# Patient Record
Sex: Male | Born: 1990 | Race: Black or African American | Hispanic: No | Marital: Single | Smoking: Current some day smoker
Health system: Southern US, Community
[De-identification: ages and names within clinical notes are randomized; demographics above are authoritative.]

## PROBLEM LIST (undated history)

## (undated) HISTORY — PX: WISDOM TOOTH EXTRACTION: SHX21

---
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
## 2013-07-19 ENCOUNTER — Emergency Department (HOSPITAL_COMMUNITY)
Admission: EM | Admit: 2013-07-19 | Discharge: 2013-07-19 | Disposition: A | Discharge status: Home / Self Care | Payer: BC Managed Care – PPO | Attending: Emergency Medicine | Admitting: Emergency Medicine

## 2013-07-19 ENCOUNTER — Encounter (HOSPITAL_COMMUNITY): Payer: Self-pay | Admitting: Emergency Medicine

## 2013-07-19 DIAGNOSIS — A64 Unspecified sexually transmitted disease: Secondary | ICD-10-CM | POA: Insufficient documentation

## 2013-07-19 DIAGNOSIS — F17200 Nicotine dependence, unspecified, uncomplicated: Secondary | ICD-10-CM | POA: Insufficient documentation

## 2013-07-19 LAB — URINE MICROSCOPIC-ADD ON

## 2013-07-19 LAB — URINALYSIS, ROUTINE W REFLEX MICROSCOPIC
Bilirubin Urine: NEGATIVE
GLUCOSE, UA: NEGATIVE mg/dL
HGB URINE DIPSTICK: NEGATIVE
Ketones, ur: NEGATIVE mg/dL
Nitrite: NEGATIVE
PH: 6 (ref 5.0–8.0)
PROTEIN: NEGATIVE mg/dL
Specific Gravity, Urine: 1.031 — ABNORMAL HIGH (ref 1.005–1.030)
Urobilinogen, UA: 0.2 mg/dL (ref 0.0–1.0)

## 2013-07-19 LAB — RPR: RPR: NONREACTIVE

## 2013-07-19 MED ORDER — CEFTRIAXONE SODIUM 250 MG IJ SOLR
250.0000 mg | Freq: Once | INTRAMUSCULAR | Status: AC
  Administered 2013-07-19: 250 mg via INTRAMUSCULAR
  Filled 2013-07-19: qty 250

## 2013-07-19 MED ORDER — AZITHROMYCIN 250 MG PO TABS
1000.0000 mg | ORAL_TABLET | Freq: Once | ORAL | Status: AC
  Administered 2013-07-19: 1000 mg via ORAL
  Filled 2013-07-19: qty 4

## 2013-07-19 MED ORDER — LIDOCAINE HCL (PF) 1 % IJ SOLN
INTRAMUSCULAR | Status: AC
  Administered 2013-07-19: 5 mL
  Filled 2013-07-19: qty 5

## 2013-07-19 NOTE — ED Notes (Signed)
Pt reports burning sensation with urination. States that he is sexually active and not always using protection. No other complaints

## 2013-07-19 NOTE — ED Notes (Signed)
Assisted Lauren, PA with GC/Chlamydia swab

## 2013-07-19 NOTE — Discharge Instructions (Signed)
You have been treated in the emergency department for an infection, possibly sexually transmitted. Results of your gonorrhea and chlamydia tests are pending and you will be notified if they are positive.  Do not have any sexual consult for 7 days after you have been treated. It is very important to practice safe sex and use condoms when sexually active. If your results are positive you need to notify all sexual partners so they can be treated as well. The website [email or web address].net/ can be used to send anonymous text messages or emails to alert sexual contacts. Follow up with your doctor.  Gonorrhea and Chlamydia SYMPTOMS  In females, symptoms may go unnoticed. Symptoms that are more noticeable can include:  Belly (abdominal) pain.  Painful intercourse.  Watery mucous-like discharge from the vagina.  Miscarriage.  Discomfort when urinating.  Inflammation of the rectum.  Abnormal gray-green frothy vaginal discharge  Vaginal itching and irritatio  Itching and irritation of the area outside the vagina.   Painful urination.  Bleeding after sexual intercourse.  In males, symptoms include:  Burning with urination.  Pain in the testicles.  Watery mucous-like discharge from the penis.  It can cause longstanding (chronic) pelvic pain after frequent infections.  TREATMENT  PID can cause women to not be able to have children (sterile) if left untreated or if half-treated.  It is important to finish ALL medications given to you.  This is a sexually transmitted infection. So you are also at risk for other sexually transmitted diseases, including HIV (AIDS), it is recommended that you get tested. HOME CARE INSTRUCTIONS  Warning: This infection is contagious. Do not have sex until treatment is completed. Follow up at your caregiver's office or the clinic to which you were referred. If your diagnosis (learning what is wrong) is confirmed by culture or some other method, your recent sexual contacts  need treatment. Even if they are symptom free or have a negative culture or evaluation, they should be treated.  PREVENTION  Women should use sanitary pads instead of tampons for vaginal discharge.  Wipe front to back after using the toilet and avoid douching.   Practice safe sex, use condoms, have only one sex partner and be sure your sex partner is not having sex with others.  Ask your caregiver to test you for chlamydia at your regular checkups or sooner if you are having symptoms.  Ask for further information if you are pregnant.  SEEK IMMEDIATE MEDICAL CARE IF:  You develop an oral temperature above 102 F (38.9 C), not controlled by medications or lasting more than 2 days.  You develop an increase in pain.  You develop any type of abnormal discharge.  You develop vaginal bleeding and it is not time for your period.  You develop painful intercourse.   Bacterial Vaginosis  Bacterial vaginosis (BV) is a vaginal infection where the normal balance of bacteria in the vagina is disrupted. This is not a sexually transmitted disease and your sexual partners do NOT need to be treated. CAUSES  The cause of BV is not fully understood. BV develops when there is an increase or imbalance of harmful bacteria.  Some activities or behaviors can upset the normal balance of bacteria in the vagina and put women at increased risk including:  Having a new sex partner or multiple sex partners.  Douching.  Using an intrauterine device (IUD) for contraception.  It is not clear what role sexual activity plays in the development of BV. However, women that have  never had sexual intercourse are rarely infected with BV.  Women do not get BV from toilet seats, bedding, swimming pools or from touching objects around them.   SYMPTOMS  Grey vaginal discharge.  A fish-like odor with discharge, especially after sexual intercourse.  Itching or burning of the vagina and vulva.  Burning or pain with urination.  Some  women have no signs or symptoms at all.   TREATMENT  Sometimes BV will clear up without treatment.  BV may be treated with antibiotics.  BV can recur after treatment. If this happens, a second round of antibiotics will often be prescribed.  HOME CARE INSTRUCTIONS  Finish all medication as directed by your caregiver.  Do not have sex until treatment is completed.  Do NOT drink any alcoholic beverages while being treated  with Metronidazole (Flagyl). This will cause a severe reaction inducing vomiting.  RESOURCE GUIDE  Dental Problems  Patients with Medicaid: [facility] [phone] [address]                                           [phone] [employer] [address] Phone:  [phone]                                                  Phone:  [phone]  If unable to pay or uninsured, contact:  [facility] or [facility] to become qualified for the adult dental clinic.  Chronic Pain Problems Contact [facility]  [phone] Patients need to be referred by their primary care doctor.  Insufficient Money for Medicine Contact [employer] Way:  call "211" or [facility] [phone].  No Primary Care Doctor Call [facility]  [phone] Other agencies that provide inexpensive medical care    [facility]  [phone]    [facility]  [phone]    [facility]  [phone]    [facility]  [phone]    [facility]  [phone]    [facility]  [phone]  Psychological Services [facility]  [phone] [facility]  [phone] [facility]   [phone] (emergency services [phone])  Substance Abuse Resources [facility]  (716)841-2556 [facility] [phone] [facility] [place] [phone] Floydene Flock [phone] Residential & Outpatient Substance Abuse Program  [phone]  Abuse/Neglect [facility] Child Abuse Hotline 615 579 1302 [facility] Child Abuse Hotline (930)086-4967 (After Hours)  Emergency Shelter [facility] [phone]  [facility] [place] [facility] [place] [phone] [facility] [phone]  MRSA Hotline #:   (260)325-0581    [facility] Resources  [facility]     [employer]                          [facility] Dept. [address] [place]                       [address]  [address]  [place]                                                [place]                            [place] Phone:  [phone]                                   Phone:  [phone]                 Phone:  [phone]  [facility] Phone:  [phone]  [facility] Child Abuse Hotline [phone](336) [phone] [phone](336) [phone] (After Hours)

## 2013-07-19 NOTE — ED Provider Notes (Signed)
CSN: [id number]     Arrival date & time 07/19/13  1238 History  This chart was scribed for non-physician practitioner, Clabe Seal, PA-C, working with Gavin Pound. Oletta Lamas, MD by Shari Heritage, ED Scribe. This patient was seen in room TR11C/TR11C and the patient's care was started at 3:30 PM.  Chief Complaint  Patient presents with  . Dysuria    The history is provided by the patient. No language interpreter was used.    HPI Comments: Marcus Villanueva is a 23 y.o. male who presents to the Emergency Department complaining of constant burning dysuria onset 3-4 days ago. Patient has been trying home remedies thinking that his symptoms were due a UTI with no symptom relief. He denies associated penile or testicular lesions, testicular or penile swelling, penile discharge, penile or testicular pain, or any other urinary symptoms. There is no fever, abdominal pain, groin pain, or flank pain. He is sexually active and does not always use prophylactic measures.   History reviewed. No pertinent past medical history. Past Surgical History  Procedure Laterality Date  . Wisdom tooth extraction     History reviewed. No pertinent family history. History  Substance Use Topics  . Smoking status: Current Some Day Smoker  . Smokeless tobacco: Not on file  . Alcohol Use: No    Review of Systems  Constitutional: Negative for fever and chills.  Gastrointestinal: Negative for abdominal pain.  Genitourinary: Positive for dysuria. Negative for urgency, frequency, hematuria, flank pain, discharge, penile swelling, scrotal swelling, difficulty urinating, genital sores, penile pain and testicular pain.    Allergies  Review of patient's allergies indicates no known allergies.  Home Medications  No current outpatient prescriptions on file. Triage Vitals: BP 119/56  Pulse 58  Temp(Src) 98 F (36.7 C) (Oral)  Resp 18  Wt 144 lb 4 oz (65.431 kg)  SpO2 97% Physical Exam  Nursing note and vitals  reviewed. Constitutional: He is oriented to person, place, and time. He appears well-developed and well-nourished. No distress.  HENT:  Head: Normocephalic and atraumatic.  Eyes: EOM are normal.  Neck: Neck supple. No tracheal deviation present.  Cardiovascular: Normal rate.   Pulmonary/Chest: Effort normal. No respiratory distress.  Genitourinary: Testes normal. Right testis shows no swelling and no tenderness. Left testis shows no swelling and no tenderness. No penile tenderness. Discharge (yellow, purulent) found.  No lesions seen.  Musculoskeletal: Normal range of motion.  Lymphadenopathy:       Right: Inguinal adenopathy present.       Left: Inguinal adenopathy present.  Neurological: He is alert and oriented to person, place, and time.  Skin: Skin is warm and dry.  Psychiatric: He has a normal mood and affect. His behavior is normal.    ED Course  Procedures (including critical care time)   COORDINATION OF CARE: 3:33 PM- Patient informed of current plan for treatment and evaluation and agrees with plan at this time.  Results for orders placed during the hospital encounter of 07/19/13  URINALYSIS, ROUTINE W REFLEX MICROSCOPIC      Result Value Ref Range   Color, Urine YELLOW  YELLOW   APPearance CLEAR  CLEAR   Specific Gravity, Urine 1.031 (*) 1.005 - 1.030   pH 6.0  5.0 - 8.0   Glucose, UA NEGATIVE  NEGATIVE mg/dL   Hgb urine dipstick NEGATIVE  NEGATIVE   Bilirubin Urine NEGATIVE  NEGATIVE   Ketones, ur NEGATIVE  NEGATIVE mg/dL   Protein, ur NEGATIVE  NEGATIVE mg/dL   Urobilinogen,  UA 0.2  0.0 - 1.0 mg/dL   Nitrite NEGATIVE  NEGATIVE   Leukocytes, UA TRACE (*) NEGATIVE  URINE MICROSCOPIC-ADD ON      Result Value Ref Range   Squamous Epithelial / LPF RARE  RARE   WBC, UA 11-20  <3 WBC/hpf   RBC / HPF 0-2  <3 RBC/hpf   Bacteria, UA FEW (*) RARE   Urine-Other MUCOUS PRESENT       MDM   Final diagnoses:  STI (sexually transmitted infection)   Pt with purulent  penile discharge on exam. STD panel ordered.  Will treat for GC/Chlamydia. Discussed lab results, and treatment plan with the patient. Return precautions given. Reports understanding and no other concerns at this time.  Patient is stable for discharge at this time.  Meds given in ED:  Medications  cefTRIAXone (ROCEPHIN) injection 250 mg (250 mg Intramuscular Given 07/19/13 1547)  azithromycin (ZITHROMAX) tablet 1,000 mg (1,000 mg Oral Given 07/19/13 1548)  lidocaine (PF) (XYLOCAINE) 1 % injection (5 mLs  Given 07/19/13 1548)    There are no discharge medications for this patient.  I personally performed the services described in this documentation, which was scribed in my presence. The recorded information has been reviewed and is accurate.  Clabe SealLauren M Kyliee Ortego, PA-C 07/20/13 [phone]

## 2013-07-20 LAB — URINE CULTURE
Colony Count: NO GROWTH
Culture: NO GROWTH
Special Requests: NORMAL

## 2013-07-20 LAB — HIV ANTIBODY (ROUTINE TESTING W REFLEX): HIV: NONREACTIVE

## 2013-07-22 LAB — GC/CHLAMYDIA PROBE AMP
CT PROBE, AMP APTIMA: NEGATIVE
GC PROBE AMP APTIMA: POSITIVE — AB

## 2013-07-22 NOTE — ED Provider Notes (Signed)
Medical screening examination/treatment/procedure(s) were performed by non-physician practitioner and as supervising physician I was immediately available for consultation/collaboration.  Marcus PoundMichael Y. Oletta LamasGhim, MD 07/22/13 [phone]

## 2013-07-23 NOTE — ED Notes (Addendum)
+   Gonorrhea Patient treated with rocephin and zithromax -DHHS letter faxed

## 2013-07-26 ENCOUNTER — Telehealth (HOSPITAL_BASED_OUTPATIENT_CLINIC_OR_DEPARTMENT_OTHER): Payer: Self-pay | Admitting: *Deleted

## 2013-07-27 ENCOUNTER — Telehealth (HOSPITAL_BASED_OUTPATIENT_CLINIC_OR_DEPARTMENT_OTHER): Payer: Self-pay | Admitting: Emergency Medicine

## 2013-07-27 NOTE — Telephone Encounter (Signed)
pt returned call. ID verified x three. Notified of + Gonorrhea and that treatment was given in ED. STD instructions provided. Patient verbalized understanding.

## 2019-03-10 ENCOUNTER — Emergency Department (HOSPITAL_COMMUNITY)
Admission: EM | Admit: 2019-03-10 | Discharge: 2019-03-10 | Disposition: A | Discharge status: Home / Self Care | Payer: Self-pay | Attending: Emergency Medicine | Admitting: Emergency Medicine

## 2019-03-10 ENCOUNTER — Encounter (HOSPITAL_COMMUNITY): Payer: Self-pay | Admitting: Emergency Medicine

## 2019-03-10 ENCOUNTER — Other Ambulatory Visit: Payer: Self-pay

## 2019-03-10 ENCOUNTER — Emergency Department (HOSPITAL_COMMUNITY): Payer: Self-pay

## 2019-03-10 DIAGNOSIS — E860 Dehydration: Secondary | ICD-10-CM

## 2019-03-10 DIAGNOSIS — R404 Transient alteration of awareness: Secondary | ICD-10-CM

## 2019-03-10 DIAGNOSIS — R4182 Altered mental status, unspecified: Secondary | ICD-10-CM | POA: Insufficient documentation

## 2019-03-10 DIAGNOSIS — F17200 Nicotine dependence, unspecified, uncomplicated: Secondary | ICD-10-CM | POA: Insufficient documentation

## 2019-03-10 LAB — MAGNESIUM: Magnesium: 2 mg/dL (ref 1.7–2.4)

## 2019-03-10 LAB — COMPREHENSIVE METABOLIC PANEL
ALT: 14 U/L (ref 0–44)
AST: 21 U/L (ref 15–41)
Albumin: 4.1 g/dL (ref 3.5–5.0)
Alkaline Phosphatase: 57 U/L (ref 38–126)
Anion gap: 9 (ref 5–15)
BUN: 13 mg/dL (ref 6–20)
CO2: 27 mmol/L (ref 22–32)
Calcium: 9.5 mg/dL (ref 8.9–10.3)
Chloride: 102 mmol/L (ref 98–111)
Creatinine, Ser: 1.32 mg/dL — ABNORMAL HIGH (ref 0.61–1.24)
GFR calc Af Amer: >60 mL/min (ref >60)
GFR calc non Af Amer: >60 mL/min (ref >60)
Glucose, Bld: 84 mg/dL (ref 70–99)
Potassium: 4 mmol/L (ref 3.5–5.1)
Sodium: 138 mmol/L (ref 135–145)
Total Bilirubin: 1.3 mg/dL — ABNORMAL HIGH (ref 0.3–1.2)
Total Protein: 7.3 g/dL (ref 6.5–8.1)

## 2019-03-10 LAB — CBC WITH DIFFERENTIAL/PLATELET
Abs Immature Granulocytes: 0.01 10*3/uL (ref 0.00–0.07)
Basophils Absolute: 0 10*3/uL (ref 0.0–0.1)
Basophils Relative: 0 %
Eosinophils Absolute: 0.1 10*3/uL (ref 0.0–0.5)
Eosinophils Relative: 2 %
HCT: 44.2 % (ref 39.0–52.0)
Hemoglobin: 14.5 g/dL (ref 13.0–17.0)
Immature Granulocytes: 0 %
Lymphocytes Relative: 29 %
Lymphs Abs: 1.4 10*3/uL (ref 0.7–4.0)
MCH: 29.8 pg (ref 26.0–34.0)
MCHC: 32.8 g/dL (ref 30.0–36.0)
MCV: 90.8 fL (ref 80.0–100.0)
Monocytes Absolute: 0.4 10*3/uL (ref 0.1–1.0)
Monocytes Relative: 8 %
Neutro Abs: 2.9 10*3/uL (ref 1.7–7.7)
Neutrophils Relative %: 61 %
Platelets: 199 10*3/uL (ref 150–400)
RBC: 4.87 MIL/uL (ref 4.22–5.81)
RDW: 12.6 % (ref 11.5–15.5)
WBC: 4.7 10*3/uL (ref 4.0–10.5)
nRBC: 0 % (ref 0.0–0.2)

## 2019-03-10 MED ORDER — SODIUM CHLORIDE 0.9 % IV BOLUS
1000.0000 mL | Freq: Once | INTRAVENOUS | Status: AC
  Administered 2019-03-10: 1000 mL via INTRAVENOUS

## 2019-03-10 MED ORDER — SODIUM CHLORIDE 0.9 % IV BOLUS
1000.0000 mL | Freq: Once | INTRAVENOUS | Status: AC
  Administered 2019-03-10: 16:00:00 1000 mL via INTRAVENOUS

## 2019-03-10 MED ORDER — ACETAMINOPHEN 500 MG PO TABS: 500.0000 mg | ORAL_TABLET | Freq: Once | ORAL | Status: DC

## 2019-03-10 MED ORDER — SODIUM CHLORIDE 0.9 % IV SOLN
INTRAVENOUS | Status: DC
  Administered 2019-03-10: 17:00:00 via INTRAVENOUS

## 2019-03-10 NOTE — ED Triage Notes (Signed)
Pt states he was sitting on couch yesterday and eyes rolled back in his head and he had seizure like activity for approx 5 sec per his girlfriend.  Denies any complaints at this time.  No history of seizures.

## 2019-03-10 NOTE — ED Provider Notes (Signed)
[facility] EMERGENCY DEPARTMENT Provider Note   CSN: [id number] Arrival date & time: 03/10/19  1436     History   Chief Complaint Chief Complaint  Patient presents with  . ? Seizure    HPI Marcus Villanueva is a 28 y.o. male.     HPI  Patient presents after an episode of apparent loss of consciousness, with concern for seizure versus syncope. Currently the patient has no complaints, states that he is back in his usual state of health. Yesterday, approximately 24 hours ago, after eating a minimal amount throughout the day, he had episode of brief prodrome, with loss of consciousness. His girlfriend reported to him that he tensed up, was unresponsive for several moments.  Upon awakening the patient felt clammy, diaphoretic, but no pain. After less than 1 minute he states that he returned back to baseline. He reiterates no pain, head, chest, abdomen either before or after the event. No recent medication change, diet change, activity change. Patient is healthy, denies recent illness. He does have notable history of grandfather who had a seizure, and was deceased soon thereafter.     History reviewed. No pertinent past medical history.  There are no active problems to display for this patient.   Past Surgical History:  Procedure Laterality Date  . WISDOM TOOTH EXTRACTION          Home Medications    Prior to Admission medications   Not on File    Family History No family history on file.  Social History Social History   Tobacco Use  . Smoking status: Current Some Day Smoker  . Smokeless tobacco: Never Used  Substance Use Topics  . Alcohol use: No  . Drug use: No     Allergies   Patient has no known allergies.   Review of Systems Review of Systems  Constitutional:       Per HPI, otherwise negative  HENT:       Per HPI, otherwise negative  Respiratory:       Per HPI, otherwise negative  Cardiovascular:       Per HPI,  otherwise negative  Gastrointestinal: Negative for vomiting.  Endocrine:       Negative aside from HPI  Genitourinary:       Neg aside from HPI   Musculoskeletal:       Per HPI, otherwise negative  Skin: Negative.   Neurological: Positive for syncope. Negative for seizures.     Physical Exam Updated Vital Signs BP 112/81   Pulse 62   Temp 99.1 F (37.3 C) (Oral)   Resp 14   Ht 6' (1.829 m)   Wt 70.3 kg   SpO2 97%   BMI 21.02 kg/m   Physical Exam Vitals signs and nursing note reviewed.  Constitutional:      General: He is not in acute distress.    Appearance: He is well-developed.  HENT:     Head: Normocephalic and atraumatic.  Eyes:     Conjunctiva/sclera: Conjunctivae normal.  Cardiovascular:     Rate and Rhythm: Normal rate and regular rhythm.  Pulmonary:     Effort: Pulmonary effort is normal. No respiratory distress.     Breath sounds: No stridor.  Abdominal:     General: There is no distension.  Skin:    General: Skin is warm and dry.  Neurological:     Mental Status: He is alert and oriented to person, place, and time.      ED  Treatments / Results  Labs (all labs ordered are listed, but only abnormal results are displayed) Labs Reviewed  COMPREHENSIVE METABOLIC PANEL - Abnormal; Notable for the following components:      Result Value   Creatinine, Ser 1.32 (*)    Total Bilirubin 1.3 (*)    All other components within normal limits  CBC WITH DIFFERENTIAL/PLATELET  MAGNESIUM  URINALYSIS, ROUTINE W REFLEX MICROSCOPIC  CBG MONITORING, ED    EKG EKG Interpretation  Date/Time:  Sunday March 10 2019 16:19:17 EST Ventricular Rate:  71 PR Interval:    QRS Duration: 107 QT Interval:  355 QTC Calculation: 386 R Axis:   91 Text Interpretation: Sinus rhythm Inferolateral infarct, acute Borderline ST elevation, anterior leads, changes consistent with repolarization, Baseline wander in lead(s) V1   Abnormal EKG   Radiology Ct Head Wo Contrast   Result Date: 03/10/2019 CLINICAL DATA:  Seizure-like activity EXAM: CT HEAD WITHOUT CONTRAST TECHNIQUE: Contiguous axial images were obtained from the base of the skull through the vertex without intravenous contrast. COMPARISON:  None. FINDINGS: Brain: No evidence of acute infarction, hemorrhage, hydrocephalus, extra-axial collection or mass lesion/mass effect. Vascular: No hyperdense vessel or unexpected calcification. Skull: Normal. Negative for fracture or focal lesion. Sinuses/Orbits: No acute finding. Other: None. IMPRESSION: No acute intracranial pathology. Electronically Signed   By: Lauralyn Primes M.D.   On: 03/10/2019 16:34   Dg Chest Port 1 View  Result Date: 03/10/2019 CLINICAL DATA:  Seizure-like activity EXAM: PORTABLE CHEST 1 VIEW COMPARISON:  None. FINDINGS: The heart size and mediastinal contours are within normal limits. Both lungs are clear. The visualized skeletal structures are unremarkable. IMPRESSION: No acute abnormality of the lungs in AP portable projection. Electronically Signed   By: Lauralyn Primes M.D.   On: 03/10/2019 16:36    Procedures Procedures (including critical care time)  Medications Ordered in ED Medications  acetaminophen (TYLENOL) tablet 500 mg (has no administration in time range)  sodium chloride 0.9 % bolus 1,000 mL (0 mLs Intravenous Stopped 03/10/19 1726)  sodium chloride 0.9 % bolus 1,000 mL (1,000 mLs Intravenous New Bag/Given 03/10/19 1824)     Initial Impression / Assessment and Plan / ED Course  I have reviewed the triage vital signs and the nursing notes.  Pertinent labs & imaging results that were available during my care of the patient were reviewed by me and considered in my medical decision making (see chart for details).        5:50 PM CT reassuring, x-ray reassuring, initial labs notable for creatinine 1.3, abnormal for a 28 year old previously well male. He is aware of initial findings, states that he feels somewhat better after  fluid resuscitation has started.   7:02 PM Patient awake, alert, ambulatory, no distress, speaking clearly. Findings discussed again, including concern for dehydration. Findings otherwise reassuring.  Given his description of the episode, there is low suspicion for seizure, given the absence of substantial prodrome, and no postictal period. Absent chest pain, low suspicion for cardiac etiology, suspicion for his dehydration contributing to the episode.  With resolution here after 2 L fluid resuscitation, the patient was discharged in stable condition to follow-up with primary care. Final Clinical Impressions(s) / ED Diagnoses   Final diagnoses:  Altered level of consciousness  Dehydration    ED Discharge Orders    None       Gerhard Munch, MD 03/10/19 [phone]

## 2019-03-10 NOTE — ED Notes (Signed)
Patient transported to CT 

## 2019-03-10 NOTE — Discharge Instructions (Signed)
As discussed, your evaluation today has been largely reassuring.  But, it is important that you monitor your condition carefully, and do not hesitate to return to the ED if you develop new, or concerning changes in your condition. ? ?Otherwise, please follow-up with your physician for appropriate ongoing care. ? ?

## 2019-08-09 ENCOUNTER — Emergency Department (HOSPITAL_COMMUNITY)
Admission: EM | Admit: 2019-08-09 | Discharge: 2019-08-09 | Disposition: A | Discharge status: Home / Self Care | Payer: Self-pay | Attending: Emergency Medicine | Admitting: Emergency Medicine

## 2019-08-09 ENCOUNTER — Encounter (HOSPITAL_COMMUNITY): Payer: Self-pay

## 2019-08-09 ENCOUNTER — Other Ambulatory Visit: Payer: Self-pay

## 2019-08-09 DIAGNOSIS — M25521 Pain in right elbow: Secondary | ICD-10-CM | POA: Insufficient documentation

## 2019-08-09 DIAGNOSIS — F17200 Nicotine dependence, unspecified, uncomplicated: Secondary | ICD-10-CM | POA: Insufficient documentation

## 2019-08-09 DIAGNOSIS — M545 Low back pain: Secondary | ICD-10-CM | POA: Insufficient documentation

## 2019-08-09 DIAGNOSIS — M25561 Pain in right knee: Secondary | ICD-10-CM | POA: Insufficient documentation

## 2019-08-09 MED ORDER — IBUPROFEN 600 MG PO TABS: 600.0000 mg | ORAL_TABLET | Freq: Four times a day (QID) | ORAL | 0 refills | Status: DC | PRN

## 2019-08-09 MED ORDER — CYCLOBENZAPRINE HCL 10 MG PO TABS: 10.0000 mg | ORAL_TABLET | Freq: Two times a day (BID) | ORAL | 0 refills | Status: DC | PRN
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
## 2020-03-02 ENCOUNTER — Other Ambulatory Visit: Payer: Self-pay

## 2020-03-02 ENCOUNTER — Encounter (HOSPITAL_COMMUNITY): Payer: Self-pay | Admitting: Emergency Medicine

## 2020-03-02 ENCOUNTER — Emergency Department (HOSPITAL_COMMUNITY)
Admission: EM | Admit: 2020-03-02 | Discharge: 2020-03-03 | Disposition: A | Discharge status: Home / Self Care | Payer: Self-pay | Attending: Emergency Medicine | Admitting: Emergency Medicine

## 2020-03-02 DIAGNOSIS — R109 Unspecified abdominal pain: Secondary | ICD-10-CM

## 2020-03-02 DIAGNOSIS — Z20822 Contact with and (suspected) exposure to covid-19: Secondary | ICD-10-CM | POA: Insufficient documentation

## 2020-03-02 DIAGNOSIS — R1110 Vomiting, unspecified: Secondary | ICD-10-CM

## 2020-03-02 DIAGNOSIS — F17210 Nicotine dependence, cigarettes, uncomplicated: Secondary | ICD-10-CM | POA: Insufficient documentation

## 2020-03-02 DIAGNOSIS — R110 Nausea: Secondary | ICD-10-CM

## 2020-03-02 LAB — COMPREHENSIVE METABOLIC PANEL
ALT: 13 U/L (ref 0–44)
AST: 17 U/L (ref 15–41)
Albumin: 4.1 g/dL (ref 3.5–5.0)
Alkaline Phosphatase: 49 U/L (ref 38–126)
Anion gap: 11 (ref 5–15)
BUN: 13 mg/dL (ref 6–20)
CO2: 25 mmol/L (ref 22–32)
Calcium: 9.3 mg/dL (ref 8.9–10.3)
Chloride: 99 mmol/L (ref 98–111)
Creatinine, Ser: 1.27 mg/dL — ABNORMAL HIGH (ref 0.61–1.24)
GFR, Estimated: >60 mL/min (ref >60)
Glucose, Bld: 78 mg/dL (ref 70–99)
Potassium: 3.3 mmol/L — ABNORMAL LOW (ref 3.5–5.1)
Sodium: 135 mmol/L (ref 135–145)
Total Bilirubin: 1.5 mg/dL — ABNORMAL HIGH (ref 0.3–1.2)
Total Protein: 7.4 g/dL (ref 6.5–8.1)

## 2020-03-02 LAB — CBC WITH DIFFERENTIAL/PLATELET
Abs Immature Granulocytes: 0.01 10*3/uL (ref 0.00–0.07)
Basophils Absolute: 0 10*3/uL (ref 0.0–0.1)
Basophils Relative: 0 %
Eosinophils Absolute: 0.1 10*3/uL (ref 0.0–0.5)
Eosinophils Relative: 1 %
HCT: 44.3 % (ref 39.0–52.0)
Hemoglobin: 14.2 g/dL (ref 13.0–17.0)
Immature Granulocytes: 0 %
Lymphocytes Relative: 23 %
Lymphs Abs: 1.5 10*3/uL (ref 0.7–4.0)
MCH: 29.2 pg (ref 26.0–34.0)
MCHC: 32.1 g/dL (ref 30.0–36.0)
MCV: 91.2 fL (ref 80.0–100.0)
Monocytes Absolute: 0.5 10*3/uL (ref 0.1–1.0)
Monocytes Relative: 9 %
Neutro Abs: 4.2 10*3/uL (ref 1.7–7.7)
Neutrophils Relative %: 67 %
Platelets: 215 10*3/uL (ref 150–400)
RBC: 4.86 MIL/uL (ref 4.22–5.81)
RDW: 13 % (ref 11.5–15.5)
WBC: 6.4 10*3/uL (ref 4.0–10.5)
nRBC: 0 % (ref 0.0–0.2)

## 2020-03-02 LAB — URINALYSIS, ROUTINE W REFLEX MICROSCOPIC
Bilirubin Urine: NEGATIVE
Glucose, UA: NEGATIVE mg/dL
Hgb urine dipstick: NEGATIVE
Ketones, ur: NEGATIVE mg/dL
Leukocytes,Ua: NEGATIVE
Nitrite: NEGATIVE
Protein, ur: NEGATIVE mg/dL
Specific Gravity, Urine: 1.029 (ref 1.005–1.030)
pH: 6 (ref 5.0–8.0)

## 2020-03-02 NOTE — ED Triage Notes (Signed)
Pt requesting to be check after vomiting 5 times today with fever and chills.

## 2020-03-03 LAB — RESPIRATORY PANEL BY RT PCR (FLU A&B, COVID)
Influenza A by PCR: NEGATIVE
Influenza B by PCR: NEGATIVE
SARS Coronavirus 2 by RT PCR: NEGATIVE

## 2020-03-03 MED ORDER — ONDANSETRON 4 MG PO TBDP: 4.0000 mg | ORAL_TABLET | Freq: Three times a day (TID) | ORAL | 0 refills | Status: DC | PRN

## 2020-03-03 NOTE — Discharge Instructions (Addendum)
Your Covid test is pending, the results can be found on my chart, use the information provided to follow-up with a primary care provider.

## 2020-03-03 NOTE — ED Provider Notes (Signed)
[facility] EMERGENCY DEPARTMENT Provider Note   CSN: [id number] Arrival date & time: 03/02/20  1834     History Chief Complaint  Patient presents with  . Emesis    Marcus Villanueva is a 29 y.o. male.   Emesis Severity:  Moderate Duration:  4 hours Timing:  Sporadic Quality:  Stomach contents Able to tolerate:  Liquids and solids Progression:  Resolved Chronicity:  New Recent urination:  Normal Relieved by:  Nothing Worsened by:  Nothing Ineffective treatments:  None tried Associated symptoms: abdominal pain   Associated symptoms: no arthralgias, no chills, no cough, no diarrhea, no fever and no headaches        History reviewed. No pertinent past medical history.  There are no problems to display for this patient.   Past Surgical History:  Procedure Laterality Date  . WISDOM TOOTH EXTRACTION         No family history on file.  Social History   Tobacco Use  . Smoking status: Current Some Day Smoker  . Smokeless tobacco: Never Used  Substance Use Topics  . Alcohol use: No  . Drug use: No    Home Medications Prior to Admission medications   Medication Sig Start Date End Date Taking? Authorizing Provider  cyclobenzaprine (FLEXERIL) 10 MG tablet Take 1 tablet (10 mg total) by mouth 2 (two) times daily as needed for muscle spasms. 08/09/19   Fayrene Helper, PA-C  ibuprofen (ADVIL) 600 MG tablet Take 1 tablet (600 mg total) by mouth every 6 (six) hours as needed. 08/09/19   Fayrene Helper, PA-C  ondansetron (ZOFRAN ODT) 4 MG disintegrating tablet Take 1 tablet (4 mg total) by mouth every 8 (eight) hours as needed for up to 10 doses for nausea or vomiting. 03/03/20   Sabino Donovan, MD    Allergies    Patient has no known allergies.  Review of Systems   Review of Systems  Constitutional: Negative for chills and fever.  HENT: Negative for congestion and rhinorrhea.   Respiratory: Negative for cough and shortness of breath.   Cardiovascular:  Negative for chest pain and palpitations.  Gastrointestinal: Positive for abdominal pain and vomiting. Negative for diarrhea and nausea.  Genitourinary: Negative for difficulty urinating and dysuria.  Musculoskeletal: Negative for arthralgias and back pain.  Skin: Negative for color change and rash.  Neurological: Negative for light-headedness and headaches.    Physical Exam Updated Vital Signs BP 127/64 (BP Location: Left Arm)   Pulse 61   Temp 97.7 F (36.5 C) (Temporal)   Resp 18   Wt 66.8 kg   SpO2 100%   BMI 19.97 kg/m   Physical Exam Vitals and nursing note reviewed.  Constitutional:      General: He is not in acute distress.    Appearance: Normal appearance.  HENT:     Head: Normocephalic and atraumatic.     Nose: No rhinorrhea.  Eyes:     General:        Right eye: No discharge.        Left eye: No discharge.     Conjunctiva/sclera: Conjunctivae normal.  Cardiovascular:     Rate and Rhythm: Normal rate and regular rhythm.  Pulmonary:     Effort: Pulmonary effort is normal.     Breath sounds: No stridor.  Abdominal:     General: Abdomen is flat. There is no distension.     Palpations: Abdomen is soft.     Tenderness: There is no abdominal tenderness.  There is no guarding.     Hernia: No hernia is present.  Musculoskeletal:        General: No deformity or signs of injury.  Skin:    General: Skin is warm and dry.  Neurological:     General: No focal deficit present.     Mental Status: He is alert. Mental status is at baseline.     Motor: No weakness.  Psychiatric:        Mood and Affect: Mood normal.        Behavior: Behavior normal.        Thought Content: Thought content normal.     ED Results / Procedures / Treatments   Labs (all labs ordered are listed, but only abnormal results are displayed) Labs Reviewed  COMPREHENSIVE METABOLIC PANEL - Abnormal; Notable for the following components:      Result Value   Potassium 3.3 (*)    Creatinine, Ser  1.27 (*)    Total Bilirubin 1.5 (*)    All other components within normal limits  RESPIRATORY PANEL BY RT PCR (FLU A&B, COVID)  CBC WITH DIFFERENTIAL/PLATELET  URINALYSIS, ROUTINE W REFLEX MICROSCOPIC    EKG None  Radiology No results found.  Procedures Procedures (including critical care time)  Medications Ordered in ED Medications - No data to display  ED Course  I have reviewed the triage vital signs and the nursing notes.  Pertinent labs & imaging results that were available during my care of the patient were reviewed by me and considered in my medical decision making (see chart for details).    MDM Rules/Calculators/A&P                          Patient had a 4-hour episode of nausea vomiting that is now fully resolved.  He is here with normal vital signs a abdomen without signs of peritonitis, well-hydrated normal laboratory studies with only mild hypokalemia after my review.  Lipase was not drawn as part of first look process, however with his abdominal exam being benign tolerating p.o. and complete resolution of symptoms I do not feel we need to do further laboratory testing.  Covid testing is sent and precautions are given.  Outpatient follow-up recommended, Zofran provided in case nausea vomiting recurs Final Clinical Impression(s) / ED Diagnoses Final diagnoses:  Nausea  Vomiting in adult  Undifferentiated abdominal pain    Rx / DC Orders ED Discharge Orders         Ordered    ondansetron (ZOFRAN ODT) 4 MG disintegrating tablet  Every 8 hours PRN        03/03/20 0244           Sabino Donovan, MD 03/03/20 [phone]

## 2021-07-04 IMAGING — CT CT HEAD W/O CM
4 series · 17 of 47 positions shown, 19 images · non-contrast
Comparison: None.

CLINICAL DATA: Seizure-like activity

EXAM:
CT HEAD WITHOUT CONTRAST
TECHNIQUE: Contiguous axial images were obtained from the base of the skull
through the vertex without intravenous contrast.

[Series 3: head wo · axial · 0.42mm/px · z∈[-147,-27]mm · 7 of 32 slices shown, 9 images]
[im 4/32  brain]
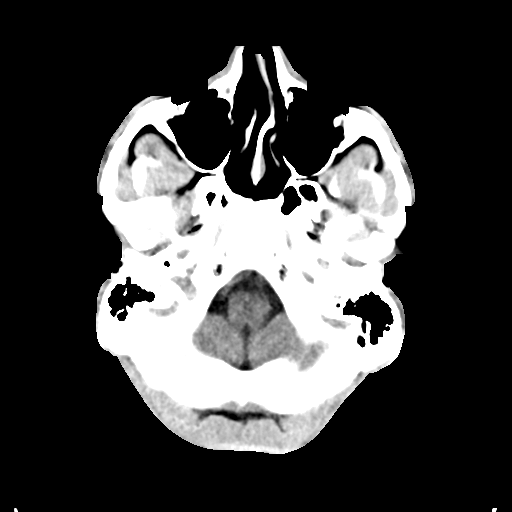
[im 4/32  bone]
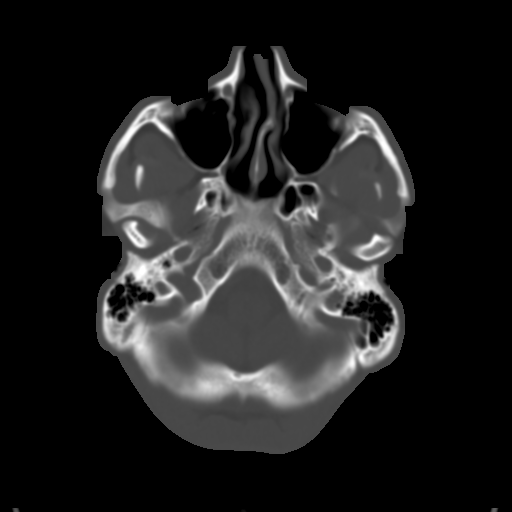
[im 8/32  brain]
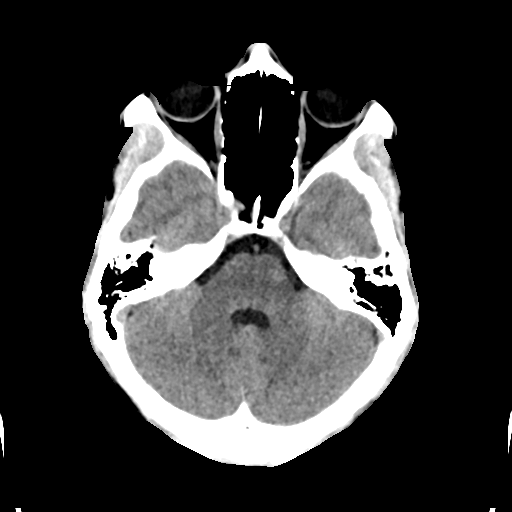
[im 12/32  brain]
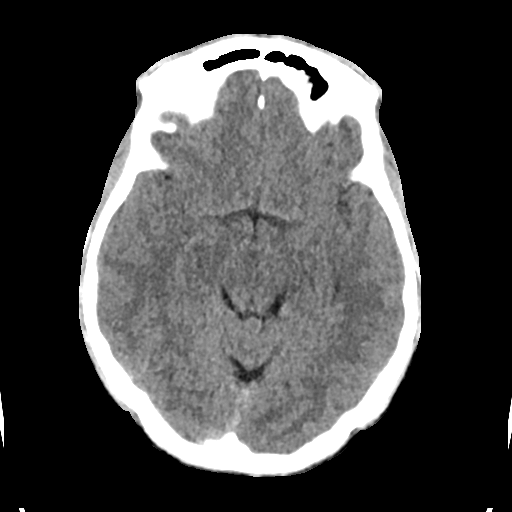
[im 16/32  brain]
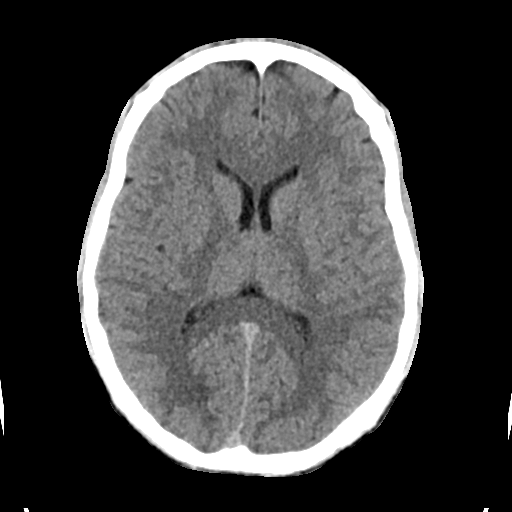
[im 20/32  brain]
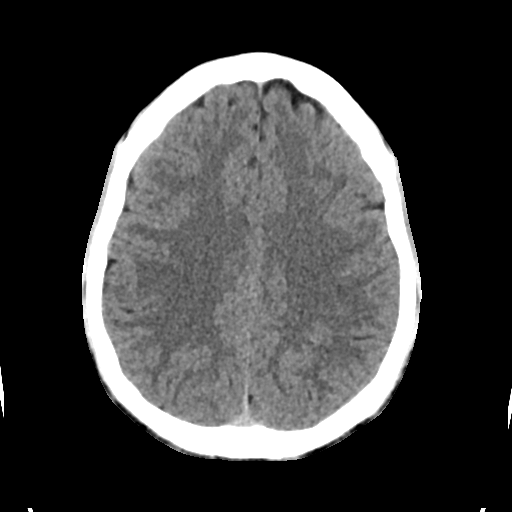
[im 20/32  bone]
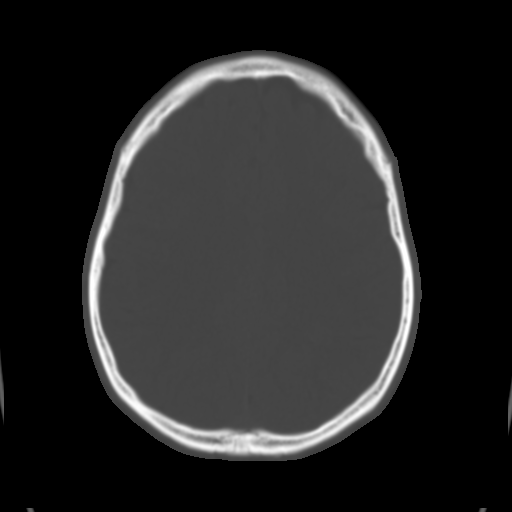
[im 24/32  brain]
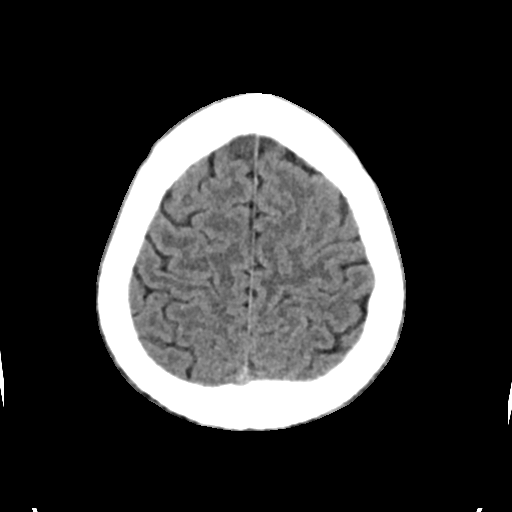
[im 28/32  brain]
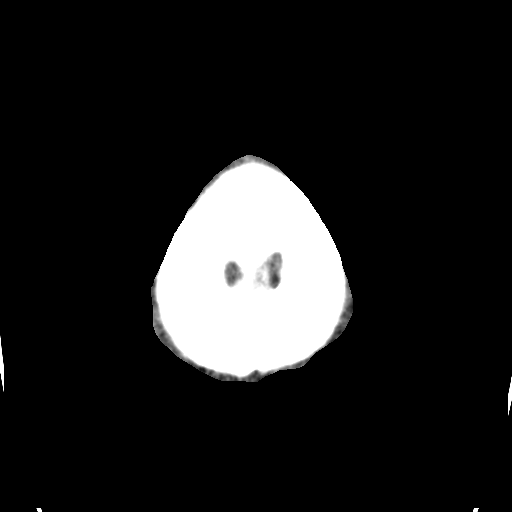

[Series 4: head bone · axial · 0.42mm/px · z∈[-148,-92]mm · 4 of 79 slices shown]
[im 8/79  bone]
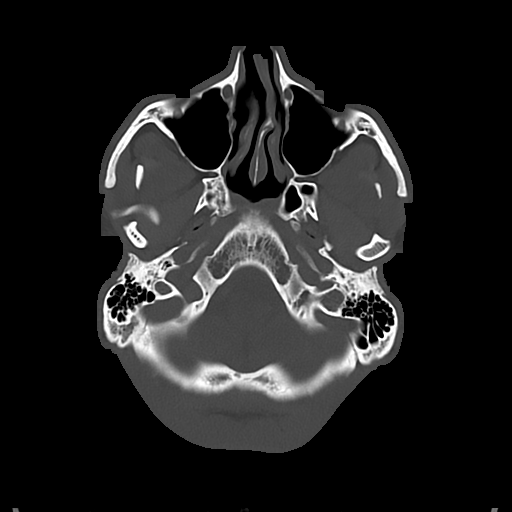
[im 16/79  bone]
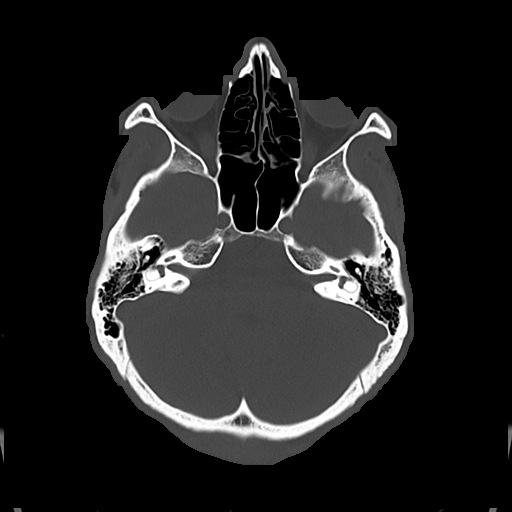
[im 24/79  bone]
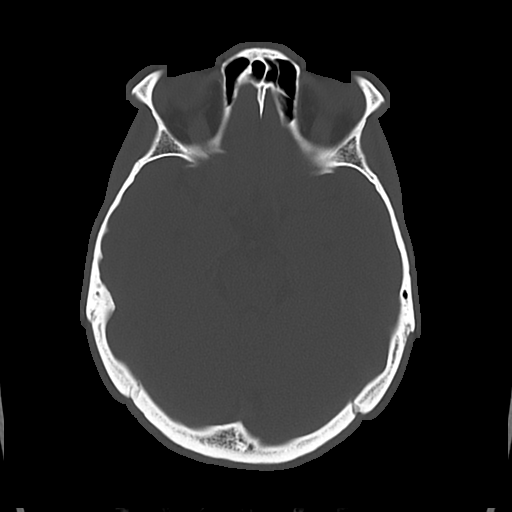
[im 36/79  bone]
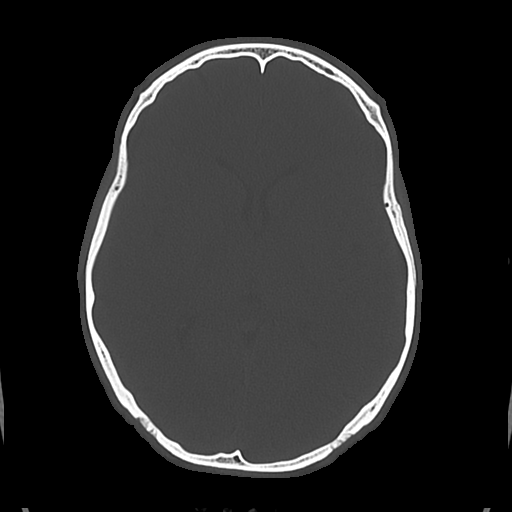

[Series 5: cor soft · coronal · 0.32mm/px · 3 of 67 slices shown]
[im 23/67  brain]
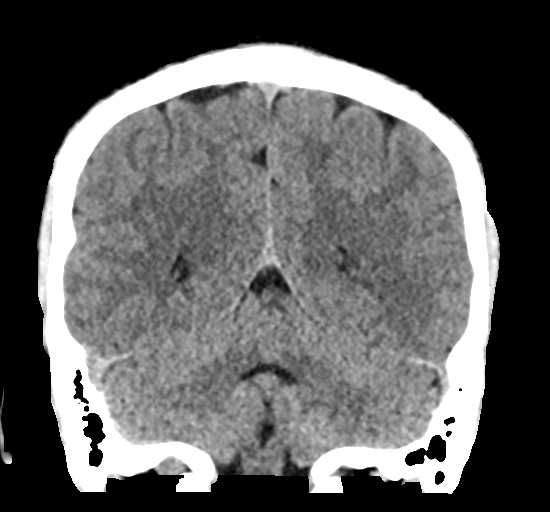
[im 30/67  brain]
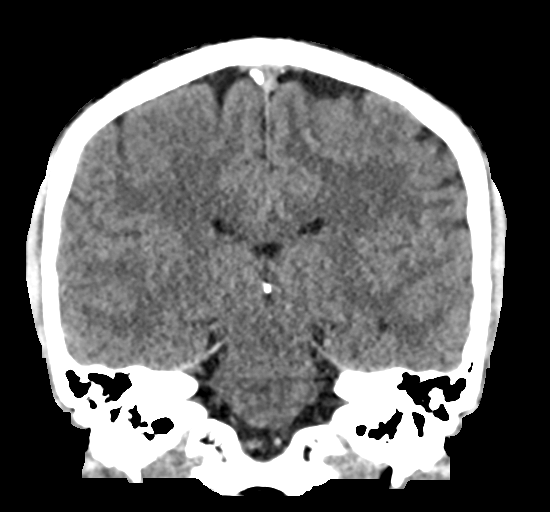
[im 37/67  brain]
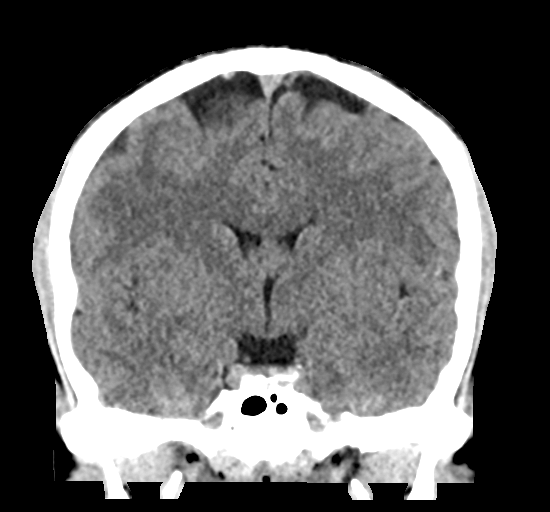

[Series 6: sag soft · sagittal · 0.31mm/px · 3 of 58 slices shown]
[im 20/58  brain]
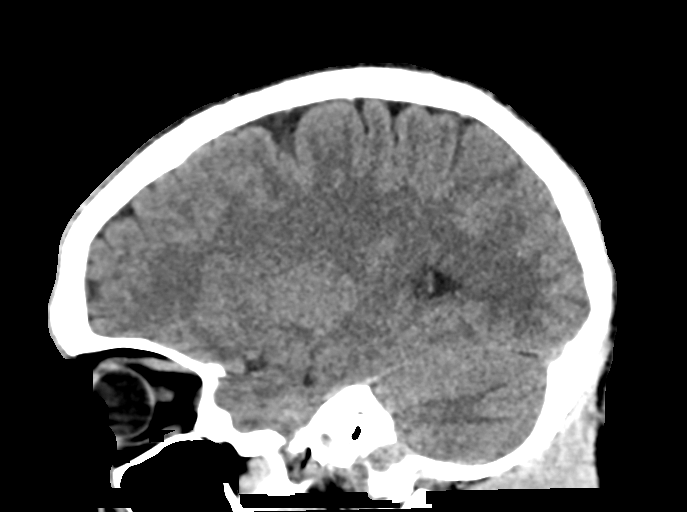
[im 29/58  brain]
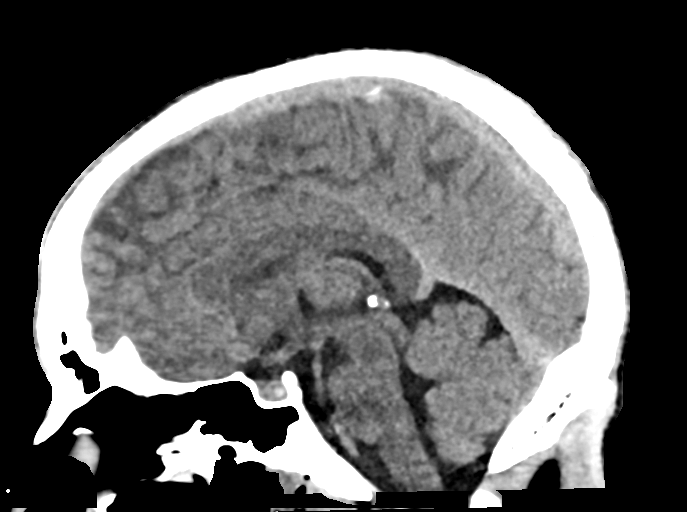
[im 39/58  brain]
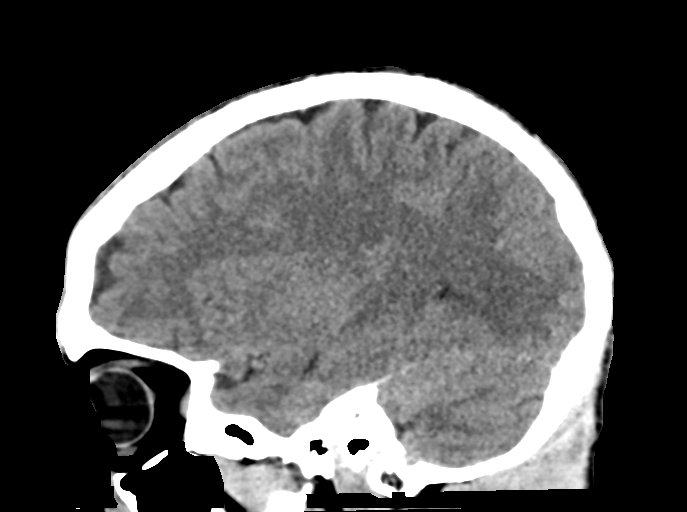

[17 of 47 positions shown; findings below may reference images not displayed]

FINDINGS: Brain: No evidence of acute infarction, hemorrhage, hydrocephalus,
extra-axial collection or mass lesion/mass effect.

Vascular: No hyperdense vessel or unexpected calcification.

Skull: Normal. Negative for fracture or focal lesion.

Sinuses/Orbits: No acute finding.

Other: None.
IMPRESSION: No acute intracranial pathology.

## 2021-07-04 IMAGING — DX DG CHEST 1V PORT
1 series · 1 of 1 positions shown · non-contrast
Comparison: None.

CLINICAL DATA: Seizure-like activity

EXAM:
PORTABLE CHEST 1 VIEW

[chest ap]
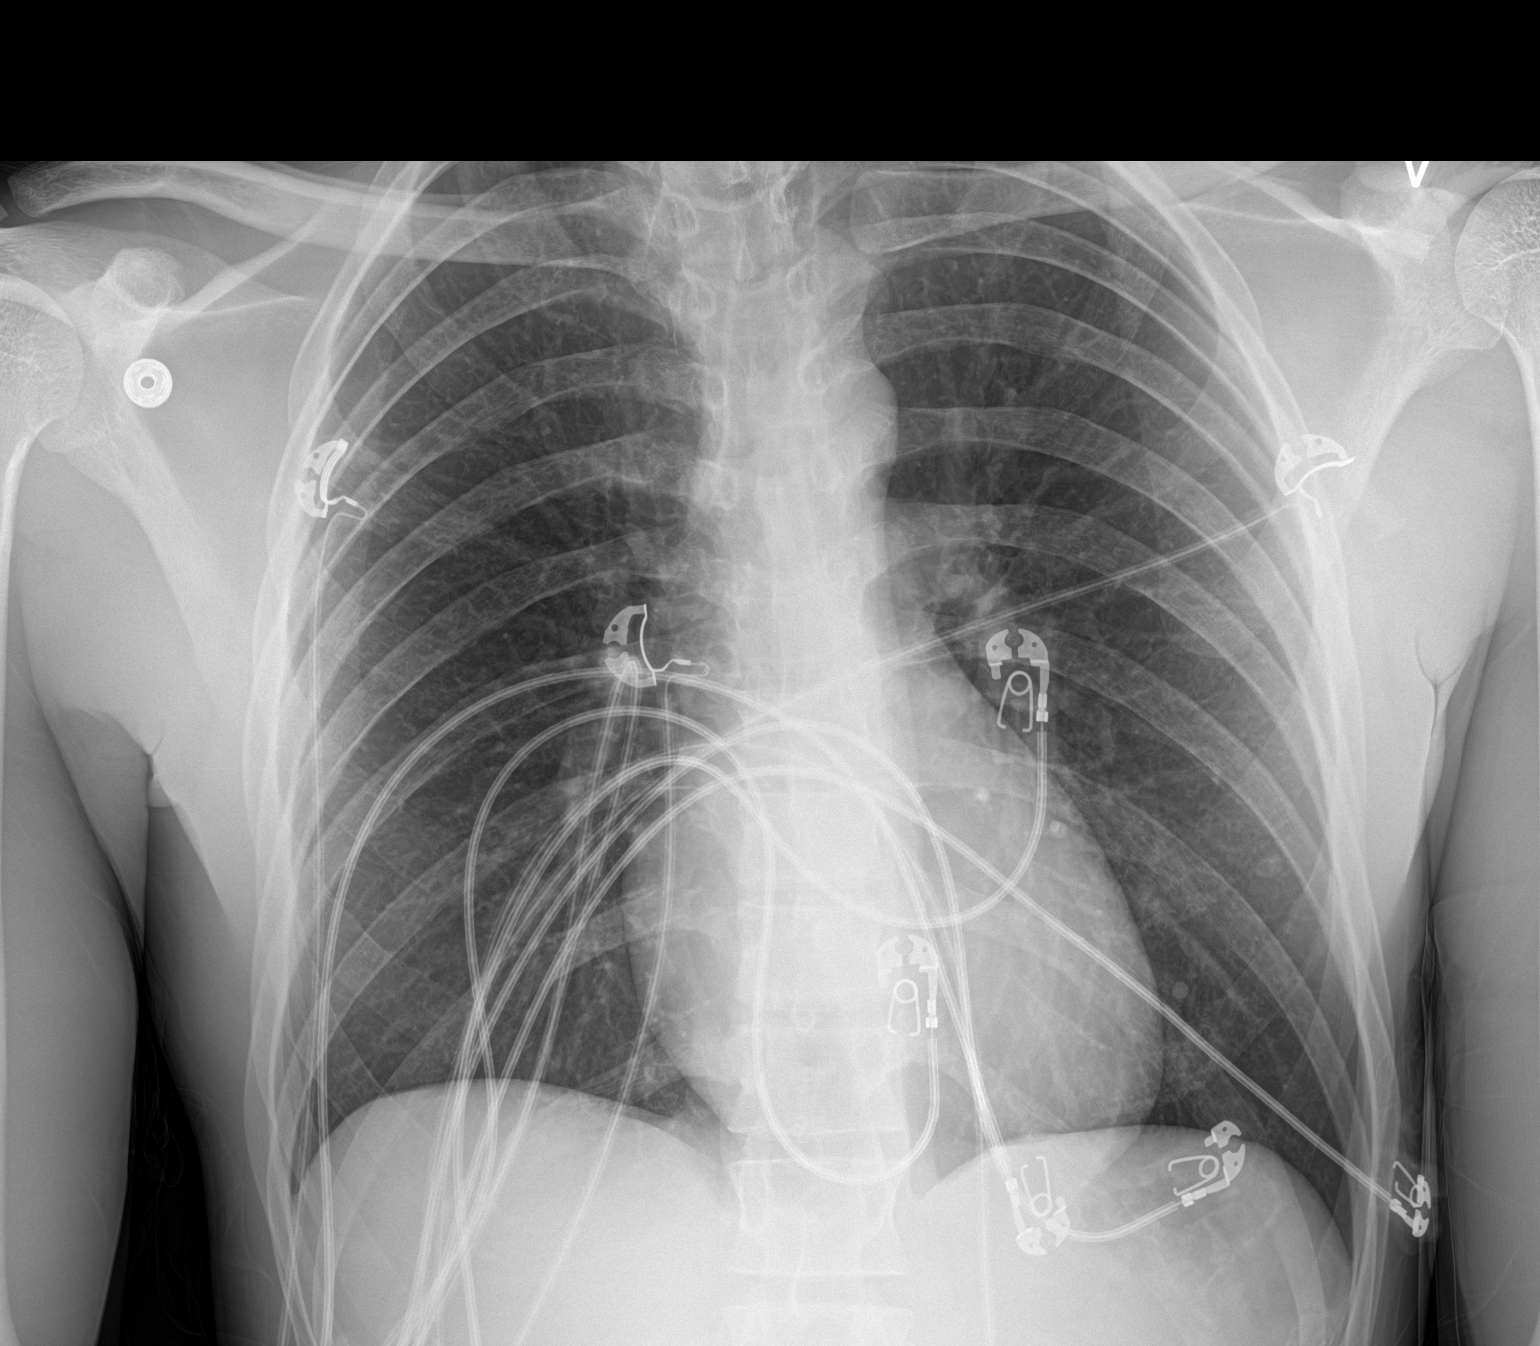

[1 of 1 positions shown; findings below may reference images not displayed]

FINDINGS: The heart size and mediastinal contours are within normal limits.
Both lungs are clear. The visualized skeletal structures are
unremarkable.
IMPRESSION: No acute abnormality of the lungs in AP portable projection.

## 2022-12-02 ENCOUNTER — Ambulatory Visit (HOSPITAL_COMMUNITY)
Admission: EM | Admit: 2022-12-02 | Discharge: 2022-12-02 | Disposition: A | Discharge status: Home / Self Care | Payer: 59 | Attending: Emergency Medicine | Admitting: Emergency Medicine

## 2022-12-02 ENCOUNTER — Encounter (HOSPITAL_COMMUNITY): Payer: Self-pay

## 2022-12-02 DIAGNOSIS — L0201 Cutaneous abscess of face: Secondary | ICD-10-CM

## 2022-12-02 MED ORDER — SULFAMETHOXAZOLE-TRIMETHOPRIM 800-160 MG PO TABS: 1.0000 | ORAL_TABLET | Freq: Two times a day (BID) | ORAL | 0 refills | Status: AC

## 2022-12-02 MED ORDER — CHLORHEXIDINE GLUCONATE 4 % EX SOLN: Freq: Every day | CUTANEOUS | 0 refills | Status: DC | PRN

## 2022-12-02 MED ORDER — AMOXICILLIN-POT CLAVULANATE 875-125 MG PO TABS: 1.0000 | ORAL_TABLET | Freq: Two times a day (BID) | ORAL | 0 refills | Status: DC

## 2022-12-02 NOTE — ED Provider Notes (Signed)
[facility]    CSN: [id number] Arrival date & time: 12/02/22  1854      History   Chief Complaint Chief Complaint  Patient presents with   Mass    HPI Marcus Villanueva is a 32 y.o. male.   Patient presents to clinic over concern of an abscess to the right side of his face that has been worsening over the past 2 days.  He has tried heating the area and he did take some ibuprofen earlier today.  He did try to pop the area earlier, also had someone else try to pop it and it was not palpable.  Reports that is hard and feels like a ball.  He denies any fevers.  He has not shaved recently.  He has not had any previous abscesses or any previous incision and drainages.  He has not shaved recently.  The history is provided by the patient and medical records.    History reviewed. No pertinent past medical history.  There are no problems to display for this patient.   Past Surgical History:  Procedure Laterality Date   WISDOM TOOTH EXTRACTION         Home Medications    Prior to Admission medications   Medication Sig Start Date End Date Taking? Authorizing Provider  chlorhexidine (HIBICLENS) 4 % external liquid Apply topically daily as needed. 12/02/22  Yes Rinaldo Ratel, [place] N, FNP  sulfamethoxazole-trimethoprim (BACTRIM DS) 800-160 MG tablet Take 1 tablet by mouth 2 (two) times daily for 7 days. 12/02/22 12/09/22 Yes Rinaldo Ratel, [place] N, FNP  cyclobenzaprine (FLEXERIL) 10 MG tablet Take 1 tablet (10 mg total) by mouth 2 (two) times daily as needed for muscle spasms. 08/09/19   Fayrene Helper, PA-C  ibuprofen (ADVIL) 600 MG tablet Take 1 tablet (600 mg total) by mouth every 6 (six) hours as needed. 08/09/19   Fayrene Helper, PA-C  ondansetron (ZOFRAN ODT) 4 MG disintegrating tablet Take 1 tablet (4 mg total) by mouth every 8 (eight) hours as needed for up to 10 doses for nausea or vomiting. 03/03/20   Sabino Donovan, MD    Family History No family history on file.  Social  History Social History   Tobacco Use   Smoking status: Some Days   Smokeless tobacco: Never  Substance Use Topics   Alcohol use: No   Drug use: No     Allergies   Patient has no known allergies.   Review of Systems Review of Systems  Constitutional:  Negative for fever.  Skin:  Positive for wound.     Physical Exam Triage Vital Signs ED Triage Vitals [12/02/22 1947]  Encounter Vitals Group     BP 122/79     Systolic BP Percentile      Diastolic BP Percentile      Pulse Rate 61     Resp 16     Temp 98.2 F (36.8 C)     Temp Source Oral     SpO2 98 %     Weight      Height      Head Circumference      Peak Flow      Pain Score      Pain Loc      Pain Education      Exclude from Growth Chart    No data found.  Updated Vital Signs BP 122/79 (BP Location: Left Arm)   Pulse 61   Temp 98.2 F (36.8 C) (Oral)   Resp  16   SpO2 98%   Visual Acuity Right Eye Distance:   Left Eye Distance:   Bilateral Distance:    Right Eye Near:   Left Eye Near:    Bilateral Near:     Physical Exam Vitals and nursing note reviewed.  Constitutional:      Appearance: Normal appearance.  HENT:     Head: Normocephalic and atraumatic.     Right Ear: External ear normal.     Left Ear: External ear normal.     Nose: Nose normal.     Mouth/Throat:     Mouth: Mucous membranes are moist.  Eyes:     Conjunctiva/sclera: Conjunctivae normal.  Cardiovascular:     Rate and Rhythm: Normal rate.  Pulmonary:     Effort: Pulmonary effort is normal. No respiratory distress.  Musculoskeletal:        General: Normal range of motion.     Cervical back: Normal range of motion.  Skin:    General: Skin is warm and dry.     Findings: Abscess present.          Comments: 2 cm x 2 cm round abscess to right cheek/beard area.  Area is indurated, without fluctuance and is tender.  No drainage.  Neurological:     General: No focal deficit present.     Mental Status: He is alert and  oriented to person, place, and time.  Psychiatric:        Mood and Affect: Mood normal.        Behavior: Behavior normal. Behavior is cooperative.      UC Treatments / Results  Labs (all labs ordered are listed, but only abnormal results are displayed) Labs Reviewed - No data to display  EKG   Radiology No results found.  Procedures Procedures (including critical care time)  Medications Ordered in UC Medications - No data to display  Initial Impression / Assessment and Plan / UC Course  I have reviewed the triage vital signs and the nursing notes.  Pertinent labs & imaging results that were available during my care of the patient were reviewed by me and considered in my medical decision making (see chart for details).  Vitals and triage reviewed, patient is hemodynamically stable.  Indurated abscess to right cheek that is without fluctuance.  Afebrile, no signs of systemic illness.  Will place on Bactrim and advised warm compress.  Return to clinic in 3 days if needed for incision and drainage.  Work note provided.  Plan of care, follow-up care and return precautions given, no questions at this time.      Final Clinical Impressions(s) / UC Diagnoses   Final diagnoses:  Facial abscess     Discharge Instructions      You have an abscess to your face.  Please continue to do warm compresses with the antibacterial solution Hibiclens 3 times daily for 10 to 15 minutes each time.  Start the antibiotics, take these with food to help prevent gastrointestinal upset.  If the area does not drain on its own or it becomes squishy, or fluctuant and you have been on antibiotics for 72 hours please return to clinic so we can drain it.  If you go back to work, please keep the area covered so it does not get exposed to dirt and other contaminates.   Return to clinic for any new or urgent symptoms, fever, vomiting, or any new concerns.      ED Prescriptions  Medication Sig  Dispense Auth. Provider   amoxicillin-clavulanate (AUGMENTIN) 875-125 MG tablet  (Status: Discontinued) Take 1 tablet by mouth every 12 (twelve) hours. 14 tablet Rinaldo Ratel, [place] N, [place]   chlorhexidine (HIBICLENS) 4 % external liquid Apply topically daily as needed. 118 mL Rinaldo Ratel, [place] N, [place]   sulfamethoxazole-trimethoprim (BACTRIM DS) 800-160 MG tablet Take 1 tablet by mouth 2 (two) times daily for 7 days. 14 tablet Christel Bai, [place] N, [place]      PDMP not reviewed this encounter.   Maleah Rabago, [place] N, [place] 12/02/22 2006

## 2022-12-02 NOTE — ED Triage Notes (Signed)
Pt presents with bump on the side of his face x 2 days.

## 2022-12-02 NOTE — Discharge Instructions (Addendum)
You have an abscess to your face.  Please continue to do warm compresses with the antibacterial solution Hibiclens 3 times daily for 10 to 15 minutes each time.  Start the antibiotics, take these with food to help prevent gastrointestinal upset.  If the area does not drain on its own or it becomes squishy, or fluctuant and you have been on antibiotics for 72 hours please return to clinic so we can drain it.  If you go back to work, please keep the area covered so it does not get exposed to dirt and other contaminates.   Return to clinic for any new or urgent symptoms, fever, vomiting, or any new concerns.

## 2023-11-26 ENCOUNTER — Encounter (HOSPITAL_COMMUNITY): Payer: Self-pay

## 2023-11-26 ENCOUNTER — Ambulatory Visit (HOSPITAL_COMMUNITY): Admission: EM | Admit: 2023-11-26 | Discharge: 2023-11-26 | Disposition: A | Discharge status: Home / Self Care

## 2023-11-26 DIAGNOSIS — Q178 Other specified congenital malformations of ear: Secondary | ICD-10-CM

## 2023-11-26 NOTE — ED Provider Notes (Signed)
 [facility]    CSN: [id number] Arrival date & time: 11/26/23  1335      History   Chief Complaint Chief Complaint  Patient presents with   Ear Injury    HPI Marcus Villanueva is a 33 y.o. male.   Discussed the use of AI scribe software for clinical note transcription with the patient, who gave verbal consent to proceed.   Patient presents with a split left earlobe that occurred on Wednesday night going into Thursday. The patient reports having an earring in the affected ear, which got caught while he was asleep, resulting in the earlobe being ripped. The patient did not seek immediate medical attention and has been managing the injury at home. He has been cleaning the area with water and antibacterial wipes. The patient denies any pain, swelling, or drainage from the site. He also reports no signs of infection, such as redness or discharge. The patient mentions that the earring was not painful prior to the incident and expresses surprise at not waking up when the injury occurred. The patient inquires about the possibility of the injury being related to wearing earrings that were too large. He also expresses concern about future piercing options and the healing process.  The following portions of the patient's history were reviewed and updated as appropriate: allergies, current medications, past family history, past medical history, past social history, past surgical history, and problem list.      History reviewed. No pertinent past medical history.  There are no active problems to display for this patient.   Past Surgical History:  Procedure Laterality Date   WISDOM TOOTH EXTRACTION         Home Medications    Prior to Admission medications   Not on File    Family History History reviewed. No pertinent family history.  Social History Social History   Tobacco Use   Smoking status: Some Days    Types: Cigars   Smokeless tobacco: Never  Vaping Use    Vaping status: Never Used  Substance Use Topics   Alcohol use: No   Drug use: No     Allergies   Patient has no known allergies.   Review of Systems Review of Systems  Skin:  Positive for wound.  All other systems reviewed and are negative.    Physical Exam Triage Vital Signs ED Triage Vitals  Encounter Vitals Group     BP 11/26/23 1526 124/78     Girls Systolic BP Percentile --      Girls Diastolic BP Percentile --      Boys Systolic BP Percentile --      Boys Diastolic BP Percentile --      Pulse Rate 11/26/23 1526 (!) 58     Resp --      Temp 11/26/23 1526 98 F (36.7 C)     Temp Source 11/26/23 1526 Oral     SpO2 11/26/23 1526 98 %     Weight 11/26/23 1525 165 lb (74.8 kg)     Height 11/26/23 1525 6' (1.829 m)     Head Circumference --      Peak Flow --      Pain Score 11/26/23 1525 0     Pain Loc --      Pain Education --      Exclude from Growth Chart --    No data found.  Updated Vital Signs BP 124/78 (BP Location: Right Arm)   Pulse (!) 58  Temp 98 F (36.7 C) (Oral)   Ht 6' (1.829 m)   Wt 165 lb (74.8 kg)   SpO2 98%   BMI 22.38 kg/m   Visual Acuity Right Eye Distance:   Left Eye Distance:   Bilateral Distance:    Right Eye Near:   Left Eye Near:    Bilateral Near:     Physical Exam Vitals reviewed.  Constitutional:      General: He is awake. He is not in acute distress.    Appearance: Normal appearance. He is well-developed. He is not ill-appearing, toxic-appearing or diaphoretic.  HENT:     Head: Normocephalic.     Right Ear: Hearing normal.     Left Ear: Hearing normal.     Ears:     Comments: Left earlobe noted to have a full-thickness linear split consistent with earring-related trauma. No surrounding erythema, warmth, drainage, or fluctuance. Wound edges appear clean with early signs of healing. No evidence of infection or foreign body. Surrounding skin intact and non-tender to palpation (see pictures below)      Nose: Nose  normal.     Mouth/Throat:     Mouth: Mucous membranes are moist.  Eyes:     General: Vision grossly intact.     Conjunctiva/sclera: Conjunctivae normal.  Cardiovascular:     Rate and Rhythm: Normal rate and regular rhythm.     Heart sounds: Normal heart sounds.  Pulmonary:     Effort: Pulmonary effort is normal.     Breath sounds: Normal breath sounds and air entry.  Musculoskeletal:        General: Normal range of motion.     Cervical back: Full passive range of motion without pain, normal range of motion and neck supple.  Skin:    General: Skin is warm and dry.  Neurological:     General: No focal deficit present.     Mental Status: He is alert and oriented to person, place, and time.  Psychiatric:        Speech: Speech normal.        Behavior: Behavior is cooperative.         UC Treatments / Results  Labs (all labs ordered are listed, but only abnormal results are displayed) Labs Reviewed - No data to display  EKG   Radiology No results found.  Procedures Procedures (including critical care time)  Medications Ordered in UC Medications - No data to display  Initial Impression / Assessment and Plan / UC Course  I have reviewed the triage vital signs and the nursing notes.  Pertinent labs & imaging results that were available during my care of the patient were reviewed by me and considered in my medical decision making (see chart for details).     Patient presents with a split earlobe sustained when an earring was caught and torn through the lobe during sleep on Wednesday night into Thursday. The injury is now outside the window for primary repair. On examination, there are no signs of infection--no drainage, erythema, or swelling--and early tissue regeneration is observed at the site. The presentation is consistent with a clean, healing earlobe laceration. Patient was advised to clean the area daily with mild soap and water, pat dry, and leave the wound  uncovered to avoid moisture buildup. Antibacterial wipes should be discontinued to reduce irritation. Education was provided on proper wound care and signs of infection. The patient was also counseled on the importance of seeking earlier medical evaluation for similar injuries  in the future. Follow up with a primary care provider or ENT is recommended if signs of infection develop or if cosmetic repair is desired.  Today's evaluation has revealed no signs of a dangerous process. Discussed diagnosis with patient and/or guardian. Patient and/or guardian aware of their diagnosis, possible red flag symptoms to watch out for and need for close follow up. Patient and/or guardian understands verbal and written discharge instructions. Patient and/or guardian comfortable with plan and disposition.  Patient and/or guardian has a clear mental status at this time, good insight into illness (after discussion and teaching) and has clear judgment to make decisions regarding their care  Documentation was completed with the aid of voice recognition software. Transcription may contain typographical errors.  Final Clinical Impressions(s) / UC Diagnoses   Final diagnoses:  Split ear lobe     Discharge Instructions      You have a split earlobe from an earring injury that occurred overnight earlier this week. The wound is currently healing without signs of infection. Clean the area gently once daily using mild soap and water, then pat it dry with a clean towel. Do not use antibacterial wipes, ointments, or cover the area, as this may trap moisture and delay healing. Allow the wound to remain open to air. Avoid manipulating or re-piercing the area until fully healed.  Watch closely for signs of infection such as redness, swelling, warmth, pain that worsens, drainage, or fever. If any of these symptoms occur, contact your primary care provider. Seek emergency care if you develop severe swelling, spreading redness, or  fever with chills. If cosmetic repair is desired once fully healed, follow up with your PCP or an ENT specialist for further evaluation.      ED Prescriptions   None    PDMP not reviewed this encounter.   Iola Lukes, [place] 11/26/23 [phone]

## 2023-11-26 NOTE — Discharge Instructions (Addendum)
 You have a split earlobe from an earring injury that occurred overnight earlier this week. The wound is currently healing without signs of infection. Clean the area gently once daily using mild soap and water, then pat it dry with a clean towel. Do not use antibacterial wipes, ointments, or cover the area, as this may trap moisture and delay healing. Allow the wound to remain open to air. Avoid manipulating or re-piercing the area until fully healed.  Watch closely for signs of infection such as redness, swelling, warmth, pain that worsens, drainage, or fever. If any of these symptoms occur, contact your primary care provider. Seek emergency care if you develop severe swelling, spreading redness, or fever with chills. If cosmetic repair is desired once fully healed, follow up with your PCP or an ENT specialist for further evaluation.

## 2023-11-26 NOTE — ED Triage Notes (Signed)
 Patient presenting with split left ear lobe onset this past Thursday. Patient states he has in heavy earrings and woke up with the split ear lobe, thinks it may have pulled on something in his sleep.   Prescriptions or OTC medications tried: No

## 2024-05-31 ENCOUNTER — Emergency Department (HOSPITAL_COMMUNITY)
Admission: EM | Admit: 2024-05-31 | Discharge: 2024-05-31 | Disposition: A | Discharge status: Home / Self Care | Payer: Self-pay | Source: Home / Self Care | Attending: Emergency Medicine | Admitting: Emergency Medicine

## 2024-05-31 ENCOUNTER — Emergency Department (HOSPITAL_COMMUNITY)

## 2024-05-31 ENCOUNTER — Encounter (HOSPITAL_COMMUNITY): Payer: Self-pay

## 2024-05-31 ENCOUNTER — Other Ambulatory Visit: Payer: Self-pay

## 2024-05-31 DIAGNOSIS — S61211A Laceration without foreign body of left index finger without damage to nail, initial encounter: Secondary | ICD-10-CM

## 2024-05-31 DIAGNOSIS — S61213A Laceration without foreign body of left middle finger without damage to nail, initial encounter: Secondary | ICD-10-CM

## 2024-05-31 DIAGNOSIS — S61012A Laceration without foreign body of left thumb without damage to nail, initial encounter: Secondary | ICD-10-CM

## 2024-05-31 DIAGNOSIS — S60222A Contusion of left hand, initial encounter: Secondary | ICD-10-CM

## 2024-05-31 MED ORDER — IBUPROFEN 400 MG PO TABS
600.0000 mg | ORAL_TABLET | Freq: Once | ORAL | Status: AC
  Administered 2024-05-31: 600 mg via ORAL
  Filled 2024-05-31: qty 1

## 2024-05-31 MED ORDER — BUPIVACAINE HCL (PF) 0.5 % IJ SOLN
10.0000 mL | Freq: Once | INTRAMUSCULAR | Status: AC
  Administered 2024-05-31: 10 mL
  Filled 2024-05-31: qty 10

## 2024-05-31 MED ORDER — OXYCODONE-ACETAMINOPHEN 5-325 MG PO TABS
1.0000 | ORAL_TABLET | Freq: Once | ORAL | Status: AC
  Administered 2024-05-31: 1 via ORAL
  Filled 2024-05-31: qty 1

## 2024-05-31 NOTE — ED Provider Triage Note (Cosign Needed)
 Emergency Medicine Provider Triage Evaluation Note  Marcus Villanueva , a 34 y.o. male  was evaluated in triage.  Pt complains of injury to left hand.  Occurred at work when his hand got caught in a wench.  Tetanus is believed to be updated. Review of Systems  Positive:  Negative:   Physical Exam  BP 121/71 (BP Location: Right Arm)   Pulse 70   Temp 98.4 F (36.9 C) (Oral)   Resp 16   Ht 6' (1.829 m)   Wt 74.8 kg   SpO2 100%   BMI 22.37 kg/m  Gen:   Awake, no distress   Resp:  Normal effort  MSK:   Neurovascular intact.  Hand wrapped in gauze.  No obvious exposed bone.  Neuro vas intact  Medical Decision Making  Medically screening exam initiated at 10:43 AM.  Appropriate orders placed.  [name] was informed that the remainder of the evaluation will be completed by another provider, this initial triage assessment does not replace that evaluation, and the importance of remaining in the ED until their evaluation is complete.  Will begin with an x-ray.   Donnajean Lynwood DEL, PA-C 05/31/24 1044

## 2024-05-31 NOTE — ED Provider Notes (Signed)
.  Laceration Repair  Date/Time: 05/31/2024 12:37 PM  Performed by: Neysa Thersia RAMAN, PA-C Authorized by: Neysa Thersia RAMAN, PA-C   Consent:    Consent obtained:  Verbal   Consent given by:  Patient   Risks discussed:  Infection Anesthesia:    Anesthesia method:  Nerve block   Block location:  1st left mcp   Block needle gauge:  25 G   Block anesthetic:  Bupivacaine  0.5% w/o epi   Block technique:  Digital   Block injection procedure:  Anatomic landmarks identified and anatomic landmarks palpated   Block outcome:  Anesthesia achieved Laceration details:    Location:  Finger   Finger location:  L thumb   Length (cm):  3   Depth (mm):  1 Pre-procedure details:    Preparation:  Patient was prepped and draped in usual sterile fashion Exploration:    Imaging obtained: x-ray     Imaging outcome: foreign body not noted   Treatment:    Area cleansed with:  Povidone-iodine   Amount of cleaning:  Standard   Irrigation solution:  Sterile saline   Irrigation volume:  50cc   Irrigation method:  Pressure wash Skin repair:    Repair method:  Sutures   Suture size:  4-0   Suture material:  Prolene   Suture technique:  Simple interrupted   Number of sutures:  4 Approximation:    Approximation:  Close Repair type:    Repair type:  Simple Post-procedure details:    Dressing:  Antibiotic ointment and sterile dressing   Procedure completion:  Tolerated well, no immediate complications      Neysa Thersia RAMAN, PA-C 05/31/24 1239    Dean Clarity, MD 05/31/24 1554

## 2024-05-31 NOTE — ED Triage Notes (Signed)
 Patient brought in by ems after left hand got caught in wench at work.  Injury noted to thumb index and middle finger no open fx noted. Reports he thinks his tetanus is updated.

## 2024-05-31 NOTE — ED Notes (Signed)
 Dressings in place by PA

## 2024-05-31 NOTE — ED Provider Notes (Signed)
 " [facility] AT [facility] Provider Note   CSN: [id number] Arrival date & time: 05/31/24  1031     Patient presents with: Hand Injury   Marcus Villanueva is a 34 y.o. male.   Pt is a 34 yo male with no significant pmhx.  He was at work and got his left hand caught in a wench.  He believes his tetanus is UTD.  He denies any other injuries.         Prior to Admission medications  Not on File    Allergies: Egg protein-containing drug products    Review of Systems  Musculoskeletal:        Left thumb, index, and middle finger pain  Skin:  Positive for wound.  All other systems reviewed and are negative.   Updated Vital Signs BP 121/71 (BP Location: Right Arm)   Pulse 70   Temp 98.4 F (36.9 C) (Oral)   Resp 16   Ht 6' (1.829 m)   Wt 74.8 kg   SpO2 100%   BMI 22.37 kg/m   Physical Exam Vitals and nursing note reviewed.  Constitutional:      Appearance: Normal appearance.  HENT:     Head: Normocephalic and atraumatic.     Right Ear: External ear normal.     Left Ear: External ear normal.     Nose: Nose normal.     Mouth/Throat:     Mouth: Mucous membranes are moist.     Pharynx: Oropharynx is clear.  Eyes:     Extraocular Movements: Extraocular movements intact.     Conjunctiva/sclera: Conjunctivae normal.     Pupils: Pupils are equal, round, and reactive to light.  Cardiovascular:     Rate and Rhythm: Normal rate and regular rhythm.     Pulses: Normal pulses.     Heart sounds: Normal heart sounds.  Pulmonary:     Effort: Pulmonary effort is normal.     Breath sounds: Normal breath sounds.  Abdominal:     General: Abdomen is flat. Bowel sounds are normal.     Palpations: Abdomen is soft.  Musculoskeletal:     Cervical back: Normal range of motion and neck supple.     Comments: Lacerations to left thumb, index finger, and middle finger  Skin:    Capillary Refill: Capillary refill takes less than 2 seconds.  Neurological:      General: No focal deficit present.     Mental Status: He is alert and oriented to person, place, and time.  Psychiatric:        Mood and Affect: Mood normal.        Behavior: Behavior normal.     (all labs ordered are listed, but only abnormal results are displayed) Labs Reviewed - No data to display  EKG: None  Radiology: DG Hand Complete Left Result Date: 05/31/2024 EXAM: 3 or more VIEW(S) XRAY OF THE LEFT HAND 05/31/2024 11:42:11 AM COMPARISON: None available. CLINICAL HISTORY: 34 year old male. Injury to the 1st, 2nd, and 3rd digit. FINDINGS: BONES AND JOINTS: No acute fracture. No malalignment. SOFT TISSUES: Soft tissue defect at distal aspect of third digit. IMPRESSION: 1. Soft tissue injury at the distal aspect of the third digit. 2. No acute fracture or dislocation identified about the left hand. Electronically signed by: Helayne Hurst MD 05/31/2024 12:24 PM EST RP Workstation: [id number]     Procedures   Medications Ordered in the ED  oxyCODONE -acetaminophen  (PERCOCET/ROXICET) 5-325 MG per tablet 1 tablet (  1 tablet Oral Given 05/31/24 1111)  ibuprofen  (ADVIL ) tablet 600 mg (600 mg Oral Given 05/31/24 1111)  bupivacaine (PF) (MARCAINE ) 0.5 % injection 10 mL (10 mLs Infiltration Given by Other 05/31/24 1119)                                    Medical Decision Making Risk Prescription drug management.   This patient presents to the ED for concern of hand injury, this involves an extensive number of treatment options, and is a complaint that carries with it a high risk of complications and morbidity.  The differential diagnosis includes fx, lac, internal hand injury   Co morbidities that complicate the patient evaluation  none   Additional history obtained:  Additional history obtained from epic chart review External records from outside source obtained and reviewed including EMS report  Imaging Studies ordered:  I ordered imaging studies including left hand  I  independently visualized and interpreted imaging which showed  Soft tissue injury at the distal aspect of the third digit.  2. No acute fracture or dislocation identified about the left hand.   I agree with the radiologist interpretation   Medicines ordered and prescription drug management:  I ordered medication including percocet, ibuprofen   for sx  Reevaluation of the patient after these medicines showed that the patient improved I have reviewed the patients home medicines and have made adjustments as needed   Problem List / ED Course:  Hand injury:  no fx.  Lacs repaired by PA Young. Pt is stable for d/c.  Return if worse.    Reevaluation:  After the interventions noted above, I reevaluated the patient and found that they have :improved   Social Determinants of Health:  Lives at home   Dispostion:  After consideration of the diagnostic results and the patients response to treatment, I feel that the patent would benefit from discharge with outpatient f/u.       Final diagnoses:  Laceration of left thumb without foreign body without damage to nail, initial encounter  Laceration of left index finger without foreign body without damage to nail, initial encounter  Laceration of left middle finger without foreign body without damage to nail, initial encounter  Contusion of left hand, initial encounter    ED Discharge Orders     None          Dean Clarity, MD 05/31/24 1243  "

## 2024-05-31 NOTE — ED Notes (Signed)
 Patient transported to X-ray
